# Patient Record
Sex: Male | Born: 1990 | Race: White | Marital: Single | Smoking: Never smoker
Health system: Southern US, Community
[De-identification: ages and names within clinical notes are randomized; demographics above are authoritative.]

---

## 2014-09-19 ENCOUNTER — Other Ambulatory Visit: Payer: Self-pay | Admitting: Occupational Medicine

## 2014-09-19 ENCOUNTER — Ambulatory Visit
Admission: RE | Admit: 2014-09-19 | Discharge: 2014-09-19 | Disposition: A | Discharge status: Home / Self Care | Payer: No Typology Code available for payment source | Source: Ambulatory Visit | Attending: Occupational Medicine | Admitting: Occupational Medicine

## 2014-09-19 DIAGNOSIS — Z021 Encounter for pre-employment examination: Secondary | ICD-10-CM

## 2015-07-10 ENCOUNTER — Encounter (HOSPITAL_COMMUNITY): Payer: Self-pay | Admitting: Emergency Medicine

## 2015-07-10 ENCOUNTER — Emergency Department (HOSPITAL_COMMUNITY)
Admission: EM | Admit: 2015-07-10 | Discharge: 2015-07-10 | Disposition: A | Discharge status: Home / Self Care | Payer: Commercial Managed Care - HMO | Source: Home / Self Care | Attending: Family Medicine | Admitting: Family Medicine

## 2015-07-10 DIAGNOSIS — R42 Dizziness and giddiness: Secondary | ICD-10-CM | POA: Diagnosis not present

## 2015-07-10 DIAGNOSIS — R002 Palpitations: Secondary | ICD-10-CM

## 2015-07-10 LAB — POCT I-STAT, CHEM 8
BUN: 16 mg/dL (ref 6–20)
CALCIUM ION: 1.22 mmol/L (ref 1.12–1.23)
CHLORIDE: 102 mmol/L (ref 101–111)
Creatinine, Ser: 1 mg/dL (ref 0.61–1.24)
Glucose, Bld: 87 mg/dL (ref 65–99)
HEMATOCRIT: 47 % (ref 39.0–52.0)
Hemoglobin: 16 g/dL (ref 13.0–17.0)
POTASSIUM: 3.7 mmol/L (ref 3.5–5.1)
SODIUM: 140 mmol/L (ref 135–145)
TCO2: 25 mmol/L (ref 0–100)

## 2015-07-10 NOTE — ED Provider Notes (Signed)
CSN: [id number]     Arrival date & time 07/10/15  1304 History   First MD Initiated Contact with Patient 07/10/15 1324     Chief Complaint  Patient presents with  . Dizziness   (Consider location/radiation/quality/duration/timing/severity/associated sxs/prior Treatment) HPI  Dizziness and foggy headed feeling. Ongoing for 1 week. Symptoms are fairly constant but has a waxing and waning nature. Worse with exercise or exertion in general. Improves with rest and is at its best typically first thing in the morning. No focal abnormality noted per patient such as unilateral weakness, slurred speech, numbness or tingling. States that he feels like he is in a fog. Denies any head trauma. Occasional palpitations. Patient states that he did have a seizure 1 when he was 24 years old and had a significant workup which showed a mild fluid collection in the temporal lobe. Follow-up scanning later which was all normal. 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 States that he stayed well-hydrated and PEs clear multiple times per day. Patient states that he cut back all caffeine around the time of onset of symptoms.   History reviewed. No pertinent past medical history. History reviewed. No pertinent past surgical history. Family History  Problem Relation Age of Onset  . Hypertension Other    Social History  Substance Use Topics  . Smoking status: Never Smoker   . Smokeless tobacco: None  . Alcohol Use: Yes    Review of Systems Per HPI with all other pertinent systems negative.    Allergies  Review of patient's allergies indicates no known allergies.  Home Medications   Prior to Admission medications   Not on File   Meds Ordered and Administered this Visit  Medications - No data to display  BP 135/84 mmHg  Pulse 83  Temp(Src) 98.6 F (37 C) (Oral)  Resp 16  SpO2 100% Orthostatic VS for the past 24 hrs:  BP- Lying Pulse- Lying BP- Sitting  Pulse- Sitting BP- Standing at 0 minutes Pulse- Standing at 0 minutes  07/10/15 1432 127/57 mmHg 72 117/63 mmHg 90 126/79 mmHg 89    Physical Exam Physical Exam  Constitutional: oriented to person, place, and time. appears well-developed and well-nourished. No distress.  HENT:  Head: Normocephalic and atraumatic.  Eyes: EOMI. PERRL.  Neck: Normal range of motion.  Cardiovascular: RRR, no m/r/g, 2+ distal pulses,  Pulmonary/Chest: Effort normal and breath sounds normal. No respiratory distress.  Abdominal: Soft. Bowel sounds are normal. NonTTP, no distension.  Musculoskeletal: Normal range of motion. Non ttp, no effusion.  Neurological: alert and oriented to person, place, and time.  Skin: Skin is warm. No rash noted. non diaphoretic.  Psychiatric: normal mood and affect. behavior is normal. Judgment and thought content normal.   ED Course  Procedures (including critical care time)  Labs Review Labs Reviewed  POCT I-STAT, CHEM 8    Imaging Review No results found.   Visual Acuity Review  Right Eye Distance:   Left Eye Distance:   Bilateral Distance:    Right Eye Near:   Left Eye Near:    Bilateral Near:         MDM   1. Dizziness   2. Palpitations    Etiology not immediately clear. Patient's symptoms seem to be a conglomeration of any of the following including caffeine withdrawal, mild viral illness, mild concussion, dehydration, disturbance of sleep patterns, other hormonal or vitamin imbalance not seen on labs today,  intermittent arrhythmia not seen on today's EKG. It would be extremely unlikely the patient had any intracranial process that this is a consideration given his history of a single seizure as a child with a temporal fluid crit collection which was seen and was reported to be normal with his follow-up scan at age 24.  Chem-8 normal, EKG showing sinus rhythm, orthostatics normal. Recommending patient take a day or 2 off from work and get plenty of  rest, patient to start using more electrolyte rich fluids for replacement, patient to follow-up with a new pruritic care physician since has not been established area. Recommendation given. Patient also to restart his caffeine intake but at half of where he was before. Patient with emergency room if his symptoms get worse.  Ozella Rocks, MD 07/10/15 [phone]

## 2015-07-10 NOTE — Discharge Instructions (Signed)
The cause of your symptoms is not immediately clear. This may be caused to a combination of things including a viral illness, dehydration (or lack of electrolyte replacement fluids), disruption of your normal sleep patterns, a mild concussion, caffeine withdrawal, or in the very rare chance something going on that his brain related. Please try and take some extra time off to rest over the next several days. Please call Dr. Durene Cal to schedule follow-up appointment recently get a more thorough workup including thyroid and other vitamin or hormone studies.

## 2015-07-10 NOTE — ED Notes (Signed)
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

## 2016-01-31 IMAGING — CR DG CHEST 1V
1 series · 1 of 1 positions shown · non-contrast
Comparison: None.

CLINICAL DATA: Pre-employment physical exam

EXAM:
CHEST - 1 VIEW

[view not recorded]
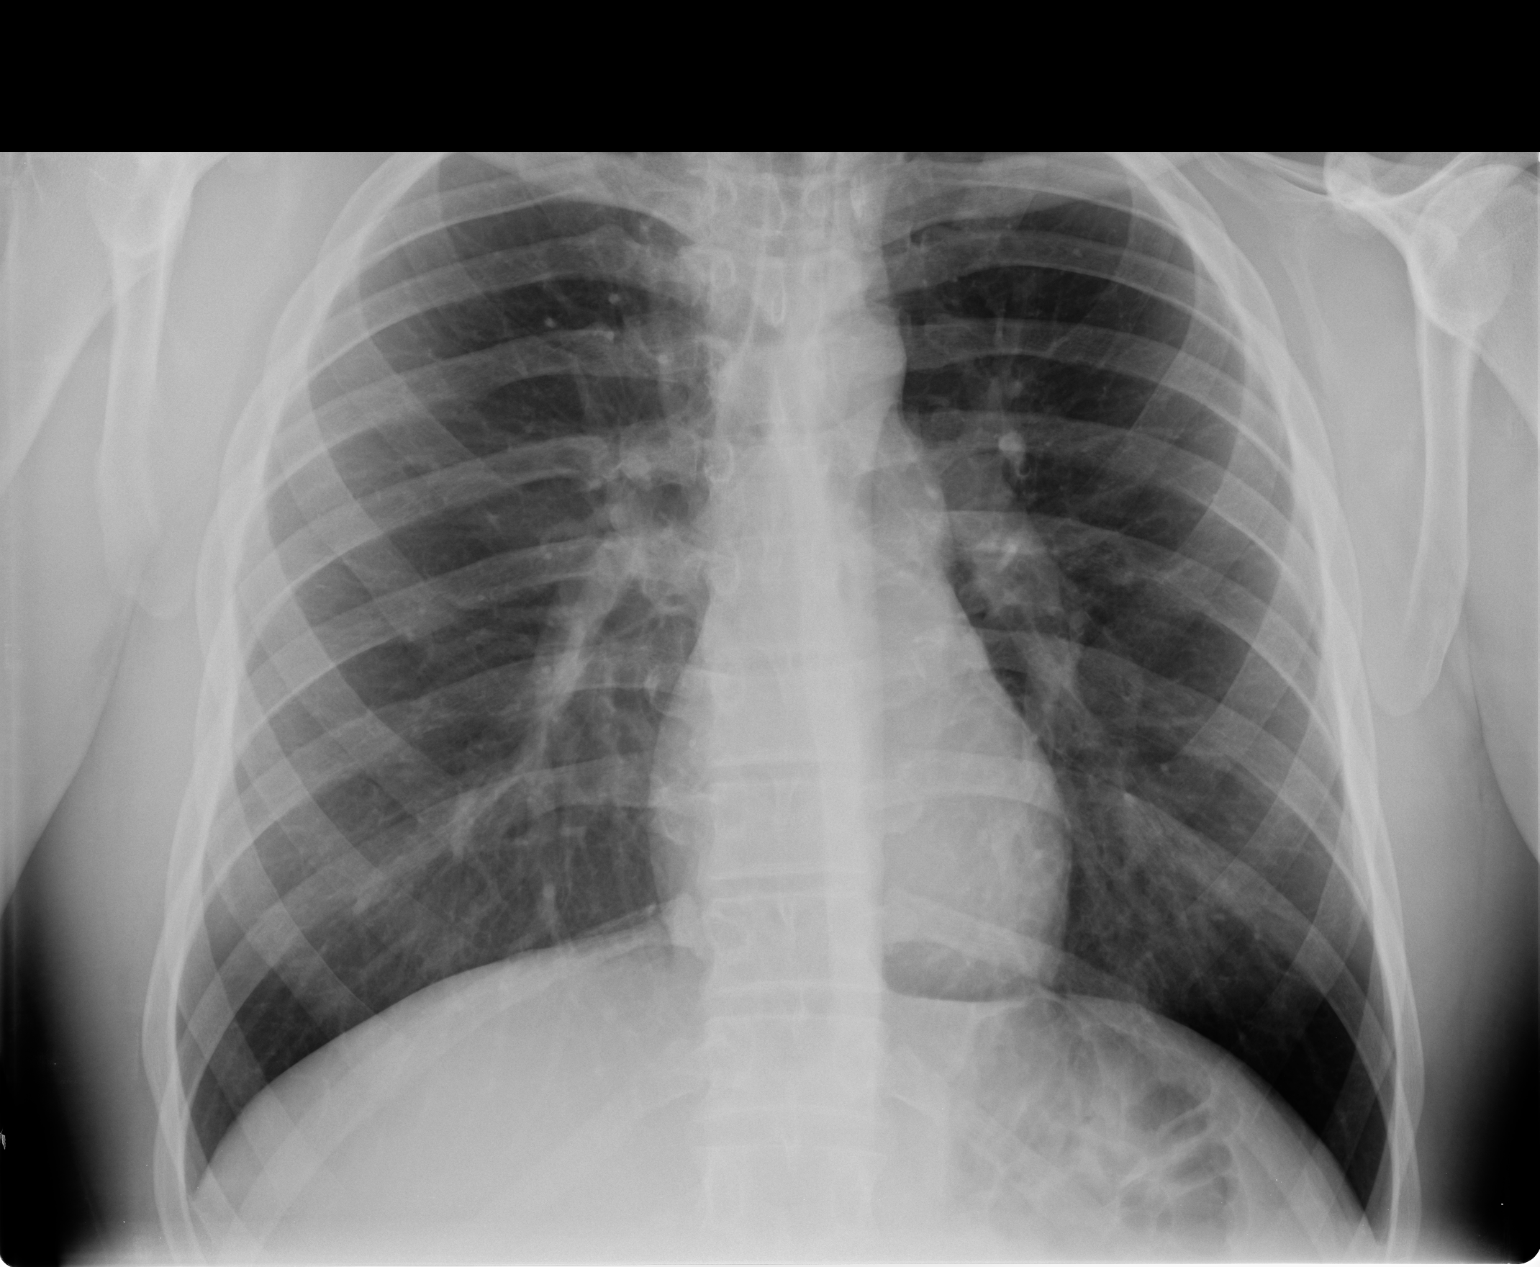

[1 of 1 positions shown; findings below may reference images not displayed]

FINDINGS: No active infiltrate or effusion is seen. Mediastinal and hilar
contours are unremarkable. The heart is within normal limits in
size. No bony abnormality is seen.
IMPRESSION: No active disease.

## 2016-02-29 ENCOUNTER — Ambulatory Visit (HOSPITAL_COMMUNITY)
Admission: EM | Admit: 2016-02-29 | Discharge: 2016-02-29 | Disposition: A | Discharge status: Home / Self Care | Payer: Worker's Compensation | Attending: Emergency Medicine | Admitting: Emergency Medicine

## 2016-02-29 ENCOUNTER — Encounter (HOSPITAL_COMMUNITY): Payer: Self-pay | Admitting: Emergency Medicine

## 2016-02-29 DIAGNOSIS — L237 Allergic contact dermatitis due to plants, except food: Secondary | ICD-10-CM | POA: Diagnosis not present

## 2016-02-29 MED ORDER — PREDNISONE 20 MG PO TABS: ORAL_TABLET | ORAL | Status: AC

## 2016-02-29 NOTE — ED Notes (Signed)
[facility] with a [facility] of a possible [facility] ivy exposure. The patient stated that he has sings of the exposure on all of his body.

## 2016-02-29 NOTE — Discharge Instructions (Signed)
You have poison ivy. Take the prednisone taper as prescribed. It is important to take the whole course to prevent the rash from coming back. You can use over-the-counter calamine lotion or Benadryl cream to help with itching. You should see improvement within 2 days of starting the medicine. Follow-up as needed.

## 2016-02-29 NOTE — ED Provider Notes (Signed)
CSN: [id number]     Arrival date & time 02/29/16  1323 History   First MD Initiated Contact with Patient 02/29/16 1356     Chief Complaint  Patient presents with  . Poison Ivy   (Consider location/radiation/quality/duration/timing/severity/associated sxs/prior Treatment) HPI He is a 25 year old man here for evaluation of poison ivy. He was on were called clearing a tree from the road that was wrapped in poison ivy. Shortly thereafter, he developed an itchy rash on the right side of his face.It has spread to involve his chest, back, andsome arms. It is very itchy. He denies any eye problems or mouth or throat symptoms. He has never had poison ivy before.  History reviewed. No pertinent past medical history. History reviewed. No pertinent past surgical history. Family History  Problem Relation Age of Onset  . Hypertension Other    Social History  Substance Use Topics  . Smoking status: Never Smoker   . Smokeless tobacco: None  . Alcohol Use: Yes    Review of Systems as in history of present illness Allergies  Review of patient's allergies indicates no known allergies.  Home Medications   Prior to Admission medications   Medication Sig Start Date End Date Taking? Authorizing Provider  predniSONE (DELTASONE) 20 MG tablet Take 3 tablets daily for 5 days, then 2 tablets for 3 days, then 1 tablet for 3 days, then 1/2 tablet for 3 days. 02/29/16   Charm RingsErin J Goldie Dimmer, MD   Meds Ordered and Administered this Visit  Medications - No data to display  BP 161/74 mmHg  Pulse 66  Temp(Src) 98.2 F (36.8 C) (Oral)  Resp 18  SpO2 100% No data found.   Physical Exam  Constitutional: He is oriented to person, place, and time. He appears well-developed and well-nourished. No distress.  HENT:  Mouth/Throat: Oropharynx is clear and moist.  Eyes: Conjunctivae and EOM are normal. Pupils are equal, round, and reactive to light. Right eye exhibits no discharge. Left eye exhibits no discharge.   Neck: Neck supple.  Cardiovascular: Normal rate, regular rhythm and normal heart sounds.   No murmur heard. Pulmonary/Chest: Effort normal and breath sounds normal. No respiratory distress. He has no wheezes. He has no rales.  Neurological: He is alert and oriented to person, place, and time.  Skin:  Rash most prominent on face.He has a raised erythematous papular rash on the right face.    ED Course  Procedures (including critical care time)  Labs Review Labs Reviewed - No data to display  Imaging Review No results found.   MDM   1. Poison ivy dermatitis    Treat with prednisone taper. OTC Benadryl or calamine lotion for itching. Follow-up as needed.    Charm RingsErin J Prince Olivier, MD 02/29/16 [phone]

## 2016-11-08 ENCOUNTER — Ambulatory Visit (HOSPITAL_COMMUNITY)
Admission: RE | Admit: 2016-11-08 | Discharge: 2016-11-08 | Disposition: A | Discharge status: Home / Self Care | Payer: Commercial Managed Care - HMO | Source: Ambulatory Visit | Attending: Neurology | Admitting: Neurology

## 2016-11-08 ENCOUNTER — Other Ambulatory Visit (HOSPITAL_COMMUNITY): Payer: Self-pay | Admitting: Neurology

## 2016-11-08 DIAGNOSIS — R55 Syncope and collapse: Secondary | ICD-10-CM

## 2016-11-19 ENCOUNTER — Other Ambulatory Visit: Payer: Self-pay | Admitting: Occupational Medicine

## 2016-11-19 ENCOUNTER — Ambulatory Visit
Admission: RE | Admit: 2016-11-19 | Discharge: 2016-11-19 | Disposition: A | Discharge status: Home / Self Care | Payer: No Typology Code available for payment source | Source: Ambulatory Visit | Attending: Occupational Medicine | Admitting: Occupational Medicine

## 2016-11-19 DIAGNOSIS — Z0000 Encounter for general adult medical examination without abnormal findings: Secondary | ICD-10-CM
